# Patient Record
Sex: Female | Born: 1995 | Race: White | Hispanic: No | Marital: Single | Smoking: Never smoker
Health system: Southern US, Community
[De-identification: ages and names within clinical notes are randomized; demographics above are authoritative.]

## PROBLEM LIST (undated history)

## (undated) DIAGNOSIS — F909 Attention-deficit hyperactivity disorder, unspecified type: Secondary | ICD-10-CM

## (undated) DIAGNOSIS — J45909 Unspecified asthma, uncomplicated: Secondary | ICD-10-CM

## (undated) DIAGNOSIS — T7840XA Allergy, unspecified, initial encounter: Secondary | ICD-10-CM

## (undated) HISTORY — DX: Allergy, unspecified, initial encounter: T78.40XA

## (undated) HISTORY — DX: Attention-deficit hyperactivity disorder, unspecified type: F90.9

## (undated) HISTORY — DX: Unspecified asthma, uncomplicated: J45.909

---

## 2020-05-01 DIAGNOSIS — E860 Dehydration: Secondary | ICD-10-CM | POA: Diagnosis not present

## 2020-05-01 DIAGNOSIS — D72829 Elevated white blood cell count, unspecified: Secondary | ICD-10-CM | POA: Diagnosis not present

## 2020-05-01 DIAGNOSIS — R197 Diarrhea, unspecified: Secondary | ICD-10-CM | POA: Diagnosis not present

## 2020-05-01 DIAGNOSIS — R112 Nausea with vomiting, unspecified: Secondary | ICD-10-CM | POA: Diagnosis not present

## 2020-11-19 DIAGNOSIS — J101 Influenza due to other identified influenza virus with other respiratory manifestations: Secondary | ICD-10-CM | POA: Diagnosis not present

## 2020-12-31 DIAGNOSIS — D485 Neoplasm of uncertain behavior of skin: Secondary | ICD-10-CM | POA: Diagnosis not present

## 2020-12-31 DIAGNOSIS — D2271 Melanocytic nevi of right lower limb, including hip: Secondary | ICD-10-CM | POA: Diagnosis not present

## 2020-12-31 DIAGNOSIS — L700 Acne vulgaris: Secondary | ICD-10-CM | POA: Diagnosis not present

## 2021-01-21 DIAGNOSIS — D2271 Melanocytic nevi of right lower limb, including hip: Secondary | ICD-10-CM | POA: Diagnosis not present

## 2021-01-21 DIAGNOSIS — L988 Other specified disorders of the skin and subcutaneous tissue: Secondary | ICD-10-CM | POA: Diagnosis not present

## 2021-01-28 DIAGNOSIS — R5383 Other fatigue: Secondary | ICD-10-CM | POA: Diagnosis not present

## 2021-01-28 DIAGNOSIS — Z0000 Encounter for general adult medical examination without abnormal findings: Secondary | ICD-10-CM | POA: Diagnosis not present

## 2021-02-22 DIAGNOSIS — Z124 Encounter for screening for malignant neoplasm of cervix: Secondary | ICD-10-CM | POA: Diagnosis not present

## 2021-04-02 LAB — HM PAP SMEAR

## 2021-04-08 DIAGNOSIS — Z1152 Encounter for screening for COVID-19: Secondary | ICD-10-CM | POA: Diagnosis not present

## 2021-04-08 DIAGNOSIS — U071 COVID-19: Secondary | ICD-10-CM | POA: Diagnosis not present

## 2021-04-22 DIAGNOSIS — Z7189 Other specified counseling: Secondary | ICD-10-CM | POA: Diagnosis not present

## 2021-04-22 DIAGNOSIS — L700 Acne vulgaris: Secondary | ICD-10-CM | POA: Diagnosis not present

## 2021-04-22 DIAGNOSIS — D2271 Melanocytic nevi of right lower limb, including hip: Secondary | ICD-10-CM | POA: Diagnosis not present

## 2022-03-03 DIAGNOSIS — R0981 Nasal congestion: Secondary | ICD-10-CM | POA: Diagnosis not present

## 2022-03-03 DIAGNOSIS — Z20822 Contact with and (suspected) exposure to covid-19: Secondary | ICD-10-CM | POA: Diagnosis not present

## 2022-03-03 DIAGNOSIS — R051 Acute cough: Secondary | ICD-10-CM | POA: Diagnosis not present

## 2022-03-03 DIAGNOSIS — J00 Acute nasopharyngitis [common cold]: Secondary | ICD-10-CM | POA: Diagnosis not present

## 2022-04-15 ENCOUNTER — Ambulatory Visit (INDEPENDENT_AMBULATORY_CARE_PROVIDER_SITE_OTHER): Payer: BC Managed Care – PPO

## 2022-04-15 ENCOUNTER — Ambulatory Visit (INDEPENDENT_AMBULATORY_CARE_PROVIDER_SITE_OTHER): Payer: BC Managed Care – PPO | Admitting: Internal Medicine

## 2022-04-15 ENCOUNTER — Encounter: Payer: Self-pay | Admitting: Internal Medicine

## 2022-04-15 VITALS — BP 114/62 | HR 91 | Temp 98.4°F | Resp 16 | Ht 67.0 in | Wt 132.0 lb

## 2022-04-15 DIAGNOSIS — F900 Attention-deficit hyperactivity disorder, predominantly inattentive type: Secondary | ICD-10-CM | POA: Diagnosis not present

## 2022-04-15 DIAGNOSIS — J4520 Mild intermittent asthma, uncomplicated: Secondary | ICD-10-CM

## 2022-04-15 DIAGNOSIS — Z0001 Encounter for general adult medical examination with abnormal findings: Secondary | ICD-10-CM | POA: Diagnosis not present

## 2022-04-15 DIAGNOSIS — R053 Chronic cough: Secondary | ICD-10-CM | POA: Insufficient documentation

## 2022-04-15 DIAGNOSIS — R059 Cough, unspecified: Secondary | ICD-10-CM | POA: Diagnosis not present

## 2022-04-15 LAB — CBC WITH DIFFERENTIAL/PLATELET
Basophils Absolute: 0.1 10*3/uL (ref 0.0–0.1)
Basophils Relative: 0.8 % (ref 0.0–3.0)
Eosinophils Absolute: 0.2 10*3/uL (ref 0.0–0.7)
Eosinophils Relative: 2.5 % (ref 0.0–5.0)
HCT: 39.9 % (ref 36.0–46.0)
Hemoglobin: 13.3 g/dL (ref 12.0–15.0)
Lymphocytes Relative: 34.2 % (ref 12.0–46.0)
Lymphs Abs: 2.6 10*3/uL (ref 0.7–4.0)
MCHC: 33.4 g/dL (ref 30.0–36.0)
MCV: 78.4 fl (ref 78.0–100.0)
Monocytes Absolute: 0.4 10*3/uL (ref 0.1–1.0)
Monocytes Relative: 5.7 % (ref 3.0–12.0)
Neutro Abs: 4.2 10*3/uL (ref 1.4–7.7)
Neutrophils Relative %: 56.8 % (ref 43.0–77.0)
Platelets: 252 10*3/uL (ref 150.0–400.0)
RBC: 5.09 Mil/uL (ref 3.87–5.11)
RDW: 14.1 % (ref 11.5–15.5)
WBC: 7.5 10*3/uL (ref 4.0–10.5)

## 2022-04-15 LAB — COMPREHENSIVE METABOLIC PANEL
ALT: 12 U/L (ref 0–35)
AST: 16 U/L (ref 0–37)
Albumin: 4.9 g/dL (ref 3.5–5.2)
Alkaline Phosphatase: 32 U/L — ABNORMAL LOW (ref 39–117)
BUN: 11 mg/dL (ref 6–23)
CO2: 24 mEq/L (ref 19–32)
Calcium: 9.9 mg/dL (ref 8.4–10.5)
Chloride: 103 mEq/L (ref 96–112)
Creatinine, Ser: 0.82 mg/dL (ref 0.40–1.20)
GFR: 99.3 mL/min (ref >60.00)
Glucose, Bld: 86 mg/dL (ref 70–99)
Potassium: 3.7 mEq/L (ref 3.5–5.1)
Sodium: 137 mEq/L (ref 135–145)
Total Bilirubin: 0.6 mg/dL (ref 0.2–1.2)
Total Protein: 7.9 g/dL (ref 6.0–8.3)

## 2022-04-15 LAB — LIPID PANEL
Cholesterol: 161 mg/dL (ref 0–200)
HDL: 78.1 mg/dL (ref >39.00)
LDL Cholesterol: 74 mg/dL (ref 0–99)
NonHDL: 82.67
Total CHOL/HDL Ratio: 2
Triglycerides: 43 mg/dL (ref 0.0–149.0)
VLDL: 8.6 mg/dL (ref 0.0–40.0)

## 2022-04-15 LAB — C-REACTIVE PROTEIN: CRP: <1 mg/dL (ref 0.5–20.0)

## 2022-04-15 MED ORDER — AMPHETAMINE-DEXTROAMPHET ER 20 MG PO CP24: 20.0000 mg | ORAL_CAPSULE | ORAL | 0 refills | Status: DC

## 2022-04-15 MED ORDER — BUDESONIDE-FORMOTEROL FUMARATE 80-4.5 MCG/ACT IN AERO: 2.0000 | INHALATION_SPRAY | Freq: Two times a day (BID) | RESPIRATORY_TRACT | 1 refills | Status: DC

## 2022-04-15 NOTE — Patient Instructions (Signed)

## 2022-04-15 NOTE — Progress Notes (Signed)
Subjective:  Patient ID: Christy Lee, female    DOB: February 02, 1996  Age: 26 y.o. MRN: [id number]  CC: Annual Exam, Cough, and Asthma   HPI Christy Lee presents for a CPX and to establish.  She has a history of allergies and exercise-induced asthma.  For the last 3 months she has had nonproductive cough, chest tightness, and wheezing.  She has been using an albuterol inhaler.  She also has a history of ADHD and tells me she was well controlled with 20 mg of Adderall a day.  At work she feels distracted, has a lack of focus, and decreased productivity.   Past Medical History:  Diagnosis Date   ADHD    Allergy    Asthma    History reviewed. No pertinent surgical history.  reports that she has never smoked. She has never used smokeless tobacco. She reports current alcohol use of about 1.0 standard drink per week. She reports that she does not use drugs. family history includes Diabetes in her father; Healthy in her mother; Hyperlipidemia in her father; Hypertension in her father and paternal grandfather. Allergies  Allergen Reactions   Grass Pollen(K-O-R-T-Swt Vern)    Molds & Smuts     No outpatient medications prior to visit.   No facility-administered medications prior to visit.    ROS Review of Systems  Constitutional:  Negative for chills, diaphoresis, fatigue and fever.  HENT: Negative.  Negative for sore throat and trouble swallowing.   Respiratory:  Positive for cough, chest tightness and wheezing. Negative for shortness of breath and stridor.   Cardiovascular:  Negative for chest pain, palpitations and leg swelling.  Gastrointestinal:  Negative for abdominal pain, constipation, diarrhea, nausea and vomiting.  Genitourinary: Negative.  Negative for difficulty urinating.  Musculoskeletal: Negative.   Skin: Negative.  Negative for rash.  Neurological:  Negative for dizziness, weakness and light-headedness.  Hematological:  Negative for  adenopathy. Does not bruise/bleed easily.  Psychiatric/Behavioral:  Positive for decreased concentration. Negative for behavioral problems, dysphoric mood, self-injury and sleep disturbance. The patient is not nervous/anxious.    Objective:  BP 114/62 (BP Location: Left Arm, Patient Position: Sitting, Cuff Size: Large)   Pulse 91   Temp 98.4 F (36.9 C) (Oral)   Resp 16   Ht 5\' 7"  (1.702 m)   Wt 132 lb (59.9 kg)   LMP 04/15/2022 (Approximate) Comment: IUD  SpO2 99%   BMI 20.67 kg/m   BP Readings from Last 3 Encounters:  04/15/22 114/62    Wt Readings from Last 3 Encounters:  04/15/22 132 lb (59.9 kg)    Physical Exam Vitals reviewed.  Constitutional:      General: She is not in acute distress.    Appearance: Normal appearance. She is not ill-appearing, toxic-appearing or diaphoretic.  HENT:     Nose: Nose normal.     Mouth/Throat:     Mouth: Mucous membranes are moist.  Eyes:     General: No scleral icterus.    Conjunctiva/sclera: Conjunctivae normal.  Cardiovascular:     Rate and Rhythm: Normal rate and regular rhythm.     Heart sounds: No murmur heard.   No gallop.  Pulmonary:     Breath sounds: Examination of the right-middle field reveals wheezing. Wheezing present. No decreased breath sounds, rhonchi or rales.  Abdominal:     General: Abdomen is flat.     Palpations: There is no mass.     Tenderness: There is no abdominal tenderness. There is no  guarding.     Hernia: No hernia is present.  Musculoskeletal:        General: Normal range of motion.     Cervical back: Neck supple.     Right lower leg: No edema.     Left lower leg: No edema.  Lymphadenopathy:     Cervical: No cervical adenopathy.  Skin:    General: Skin is warm and dry.  Neurological:     General: No focal deficit present.     Mental Status: She is alert. Mental status is at baseline.  Psychiatric:        Mood and Affect: Mood normal.        Behavior: Behavior normal.    Lab Results   Component Value Date   WBC 7.5 04/15/2022   HGB 13.3 04/15/2022   HCT 39.9 04/15/2022   PLT 252.0 04/15/2022   GLUCOSE 86 04/15/2022   CHOL 161 04/15/2022   TRIG 43.0 04/15/2022   HDL 78.10 04/15/2022   LDLCALC 74 04/15/2022   ALT 12 04/15/2022   AST 16 04/15/2022   NA 137 04/15/2022   K 3.7 04/15/2022   CL 103 04/15/2022   CREATININE 0.82 04/15/2022   BUN 11 04/15/2022   CO2 24 04/15/2022    No results found.   Assessment & Plan:   Christy Lee was seen today for annual exam, cough and asthma.  Diagnoses and all orders for this visit:  Encounter for general adult medical examination with abnormal findings- Exam completed, labs reviewed, will vaccinate against pneumonia during her next visit, cancer screenings are up-to-date, patient education was given. -     Lipid panel; Future -     HIV Antibody (routine testing w rflx); Future -     HIV Antibody (routine testing w rflx) -     Lipid panel  ADHD (attention deficit hyperactivity disorder), inattentive type -     CBC with Differential/Platelet; Future -     C-reactive protein; Future -     Comprehensive metabolic panel; Future -     Discontinue: amphetamine-dextroamphetamine (ADDERALL XR) 20 MG 24 hr capsule; Take 1 capsule (20 mg total) by mouth every morning. -     Comprehensive metabolic panel -     C-reactive protein -     CBC with Differential/Platelet  Mild intermittent asthma without complication- Labs and chest x-ray are normal.  Will start a LABA/ICS inhaler. -     CBC with Differential/Platelet; Future -     C-reactive protein; Future -     Comprehensive metabolic panel; Future -     Discontinue: budesonide-formoterol (SYMBICORT) 80-4.5 MCG/ACT inhaler; Inhale 2 puffs into the lungs 2 (two) times daily. -     Comprehensive metabolic panel -     C-reactive protein -     CBC with Differential/Platelet -     mometasone-formoterol (DULERA) 100-5 MCG/ACT AERO; Inhale 2 puffs into the lungs 2 (two) times  daily.  Chronic cough- Her chest x-ray is negative for mass or infiltrate. -     CBC with Differential/Platelet; Future -     C-reactive protein; Future -     Comprehensive metabolic panel; Future -     DG Chest 2 View; Future -     Comprehensive metabolic panel -     C-reactive protein -     CBC with Differential/Platelet   I have discontinued Christy M. Sperl "Roz"'s budesonide-formoterol. I am also having her start on mometasone-formoterol.  Meds ordered this encounter  Medications  DISCONTD: amphetamine-dextroamphetamine (ADDERALL XR) 20 MG 24 hr capsule    Sig: Take 1 capsule (20 mg total) by mouth every morning.    Dispense:  90 capsule    Refill:  0   DISCONTD: budesonide-formoterol (SYMBICORT) 80-4.5 MCG/ACT inhaler    Sig: Inhale 2 puffs into the lungs 2 (two) times daily.    Dispense:  3 each    Refill:  1   mometasone-formoterol (DULERA) 100-5 MCG/ACT AERO    Sig: Inhale 2 puffs into the lungs 2 (two) times daily.    Dispense:  39 g    Refill:  1     Follow-up: Return in about 6 months (around 10/16/2022).  Christy Calico, MD

## 2022-04-16 ENCOUNTER — Telehealth: Payer: Self-pay | Admitting: Internal Medicine

## 2022-04-16 LAB — HIV ANTIBODY (ROUTINE TESTING W REFLEX): HIV 1&2 Ab, 4th Generation: NONREACTIVE

## 2022-04-16 MED ORDER — MOMETASONE FURO-FORMOTEROL FUM 100-5 MCG/ACT IN AERO: 2.0000 | INHALATION_SPRAY | Freq: Two times a day (BID) | RESPIRATORY_TRACT | 1 refills | Status: DC

## 2022-04-16 NOTE — Telephone Encounter (Signed)
Patient was prescribed symbicort yesterday - her insurance does not cover it and it was going to be $300.00 She would like for you to send in something comparable.  Please send to [facility] on [address].  Please advise ?

## 2022-04-17 ENCOUNTER — Telehealth: Payer: Self-pay | Admitting: Internal Medicine

## 2022-04-17 ENCOUNTER — Other Ambulatory Visit: Payer: Self-pay | Admitting: Internal Medicine

## 2022-04-17 DIAGNOSIS — F900 Attention-deficit hyperactivity disorder, predominantly inattentive type: Secondary | ICD-10-CM

## 2022-04-17 MED ORDER — AMPHETAMINE-DEXTROAMPHET ER 20 MG PO CP24: 20.0000 mg | ORAL_CAPSULE | ORAL | 0 refills | Status: DC

## 2022-04-17 NOTE — Telephone Encounter (Signed)
Patient was prescribed adderall 20 mg - it was sent in to [facility] [address] - They are out of this medication - Patient called another pharmacy and would like it sent in again to  [facility] on [address] - they have the medication

## 2022-05-08 ENCOUNTER — Telehealth: Payer: Self-pay | Admitting: *Deleted

## 2022-05-08 NOTE — Telephone Encounter (Signed)
Rec'd fax PA for pt [facility] submitted via cover-my-meds w/ Key: [id number]. Sent to plan for determination.Marland KitchenRaechel Chute

## 2022-05-09 NOTE — Telephone Encounter (Signed)
Check status on PA rec'd msg " Cancelled on June 8 Your request has been cancelled" Med has been refilled or is a duplicate request../lmb

## 2022-06-04 ENCOUNTER — Encounter: Payer: Self-pay | Admitting: Internal Medicine

## 2022-06-09 NOTE — Telephone Encounter (Signed)
See phone note from 05/08/22. PA for [facility] cancelled because it had been filled. Will try PA.Marland KitchenLMB   Tried to do PA for [facility] again. With insurance [employer]  rec'd msg " PA REQUIREMENT FOR DULERA 100-5MCG/ACT AEROSOL Not Required. No PA is needed.Marland KitchenRaechel Chute

## 2022-06-11 NOTE — Telephone Encounter (Addendum)
Submitted another PA to [facility] w/ Key: [id number]. Rec'd msg back stating " This request has received a Cancelled outcome. This may mean either your patient does not have active coverage with this plan, this authorization was processed as a duplicate request, or an authorization was not needed for this medication.  Rec'd fax PA form this am called # that was on form soke w/ [place]. Gave her patient information.Marland Kitchen PA was approved. Effective 05/13/22 through 06/12/23. Case ID # [id number]. Sent pt msg via mychart PA has been approved and fax to [employer]

## 2022-06-12 DIAGNOSIS — L700 Acne vulgaris: Secondary | ICD-10-CM | POA: Diagnosis not present

## 2022-06-12 DIAGNOSIS — D2371 Other benign neoplasm of skin of right lower limb, including hip: Secondary | ICD-10-CM | POA: Diagnosis not present

## 2022-06-12 DIAGNOSIS — Z872 Personal history of diseases of the skin and subcutaneous tissue: Secondary | ICD-10-CM | POA: Diagnosis not present

## 2022-06-12 DIAGNOSIS — L814 Other melanin hyperpigmentation: Secondary | ICD-10-CM | POA: Diagnosis not present

## 2022-10-16 ENCOUNTER — Other Ambulatory Visit: Payer: Self-pay | Admitting: Internal Medicine

## 2022-10-16 DIAGNOSIS — F900 Attention-deficit hyperactivity disorder, predominantly inattentive type: Secondary | ICD-10-CM

## 2022-12-11 ENCOUNTER — Ambulatory Visit: Payer: BC Managed Care – PPO | Admitting: Internal Medicine

## 2023-01-13 ENCOUNTER — Ambulatory Visit: Payer: BC Managed Care – PPO | Admitting: Internal Medicine

## 2023-01-26 ENCOUNTER — Encounter: Payer: Self-pay | Admitting: Internal Medicine

## 2023-01-26 ENCOUNTER — Ambulatory Visit: Payer: BC Managed Care – PPO | Admitting: Internal Medicine

## 2023-01-26 VITALS — BP 122/68 | HR 81 | Temp 98.2°F | Resp 16 | Ht 67.0 in | Wt 130.0 lb

## 2023-01-26 DIAGNOSIS — F900 Attention-deficit hyperactivity disorder, predominantly inattentive type: Secondary | ICD-10-CM

## 2023-01-26 DIAGNOSIS — L853 Xerosis cutis: Secondary | ICD-10-CM

## 2023-01-26 DIAGNOSIS — Z23 Encounter for immunization: Secondary | ICD-10-CM | POA: Diagnosis not present

## 2023-01-26 DIAGNOSIS — J4520 Mild intermittent asthma, uncomplicated: Secondary | ICD-10-CM

## 2023-01-26 MED ORDER — AMPHETAMINE-DEXTROAMPHET ER 20 MG PO CP24: 20.0000 mg | ORAL_CAPSULE | ORAL | 0 refills | Status: DC

## 2023-01-26 MED ORDER — MOMETASONE FURO-FORMOTEROL FUM 100-5 MCG/ACT IN AERO: 2.0000 | INHALATION_SPRAY | Freq: Two times a day (BID) | RESPIRATORY_TRACT | 1 refills | Status: DC

## 2023-01-26 MED ORDER — AIRSUPRA 90-80 MCG/ACT IN AERO: 2.0000 | INHALATION_SPRAY | Freq: Four times a day (QID) | RESPIRATORY_TRACT | 5 refills | Status: DC | PRN

## 2023-01-26 NOTE — Patient Instructions (Signed)
Asthma, Adult ? ?Asthma is a long-term (chronic) condition that causes recurrent episodes in which the lower airways in the lungs become tight and narrow. The narrowing is caused by inflammation and tightening of the smooth muscle around the lower airways. ?Asthma episodes, also called asthma attacks or asthma flares, may cause coughing, making high-pitched whistling sounds when you breathe, most often when you breathe out (wheezing), shortness of breath, and chest pain. The airways may produce extra mucus caused by the inflammation and irritation. During an attack, it can be difficult to breathe. Asthma attacks can range from minor to life-threatening. ?Asthma cannot be cured, but medicines and lifestyle changes can help control it and treat acute attacks. It is important to keep your asthma well controlled so the condition does not interfere with your daily life. ?What are the causes? ?This condition is believed to be caused by inherited (genetic) and environmental factors, but its exact cause is not known. ?What can trigger an asthma attack? ?Many things can bring on an asthma attack or make symptoms worse. These triggers are different for every person. Common triggers include: ?Allergens and irritants like mold, dust, pet dander, cockroaches, pollen, air pollution, and chemical odors. ?Cigarette smoke. ?Weather changes and cold air. ?Stress and strong emotional responses such as crying or laughing hard. ?Certain medications such as aspirin or beta blockers. ?Infections and inflammatory conditions, such as the flu, a cold, pneumonia, or inflammation of the nasal membranes (rhinitis). ?Gastroesophageal reflux disease (GERD). ?What are the signs or symptoms? ?Symptoms may occur right after exposure to an asthma trigger or hours later and can vary by person. Common signs and symptoms include: ?Wheezing. ?Trouble breathing (shortness of breath). ?Excessive nighttime or early morning coughing. ?Chest  tightness. ?Tiredness (fatigue) with minimal activity. ?Difficulty talking in complete sentences. ?Poor exercise tolerance. ?How is this diagnosed? ?This condition is diagnosed based on: ?A physical exam and your medical history. ?Tests, which may include: ?Lung function studies to evaluate the flow of air in your lungs. ?Allergy tests. ?Imaging tests, such as X-rays. ?How is this treated? ?There is no cure, but symptoms can be controlled with proper treatment. Treatment usually involves: ?Identifying and avoiding your asthma triggers. ?Inhaled medicines. Two types are commonly used to treat asthma, depending on severity: ?Controller medicines. These help prevent asthma symptoms from occurring. They are taken every day. ?Fast-acting reliever or rescue medicines. These quickly relieve asthma symptoms. They are used as needed and provide short-term relief. ?Using other medicines, such as: ?Allergy medicines, such as antihistamines, if your asthma attacks are triggered by allergens. ?Immune medicines (immunomodulators). These are medicines that help control the immune system. ?Using supplemental oxygen. This is only needed during a severe episode. ?Creating an asthma action plan. An asthma action plan is a written plan for managing and treating your asthma attacks. This plan includes: ?A list of your asthma triggers and how to avoid them. ?Information about when medicines should be taken and when their dosage should be changed. ?Instructions about using a device called a peak flow meter. A peak flow meter measures how well the lungs are working and the severity of your asthma. It helps you monitor your condition. ?Follow these instructions at home: ?Take over-the-counter and prescription medicines only as told by your health care provider. ?Stay up to date on all vaccinations as recommended by your healthcare provider, including vaccines for the flu and pneumonia. ?Use a peak flow meter and keep track of your peak flow  readings. ?Understand and use your asthma   action plan to address any asthma flares. ?Do not smoke or allow anyone to smoke in your home. ?Contact a health care provider if: ?You have wheezing, shortness of breath, or a cough that is not responding to medicines. ?Your medicines are causing side effects, such as a rash, itching, swelling, or trouble breathing. ?You need to use a reliever medicine more than 2-3 times a week. ?Your peak flow reading is still at 50-79% of your personal best after following your action plan for 1 hour. ?You have a fever and shortness of breath. ?Get help right away if: ?You are getting worse and do not respond to treatment during an asthma attack. ?You are short of breath when at rest or when doing very little physical activity. ?You have difficulty eating, drinking, or talking. ?You have chest pain or tightness. ?You develop a fast heartbeat or palpitations. ?You have a bluish color to your lips or fingernails. ?You are light-headed or dizzy, or you faint. ?Your peak flow reading is less than 50% of your personal best. ?You feel too tired to breathe normally. ?These symptoms may be an emergency. Get help right away. Call 911. ?Do not wait to see if the symptoms will go away. ?Do not drive yourself to the hospital. ?Summary ?Asthma is a long-term (chronic) condition that causes recurrent episodes in which the airways become tight and narrow. Asthma episodes, also called asthma attacks or asthma flares, can cause coughing, wheezing, shortness of breath, and chest pain. ?Asthma cannot be cured, but medicines and lifestyle changes can help keep it well controlled and prevent asthma flares. ?Make sure you understand how to avoid triggers and how and when to use your medicines. ?Asthma attacks can range from minor to life-threatening. Get help right away if you have an asthma attack and do not respond to treatment with your usual rescue medicines. ?This information is not intended to replace  advice given to you by your health care provider. Make sure you discuss any questions you have with your health care provider. ?Document Revised: 09/04/2021 Document Reviewed: 08/26/2021 ?Elsevier Patient Education ? 2023 Elsevier Inc. ? ?

## 2023-01-26 NOTE — Progress Notes (Signed)
Subjective:  Patient ID: Christy Lee, female    DOB: 07-Jan-1996  Age: 27 y.o. 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  She denies cough, hemoptysis, fever, chills, or night sweats.  She is concerned about dryness and discoloration over her knuckles.  Outpatient Medications Prior to Visit  Medication Sig Dispense Refill   amphetamine-dextroamphetamine (ADDERALL XR) 20 MG 24 hr capsule Take 1 capsule (20 mg total) by mouth every morning. 90 capsule 0   mometasone-formoterol (DULERA) 100-5 MCG/ACT AERO Inhale 2 puffs into the lungs 2 (two) times daily. 39 g 1   No facility-administered medications prior to visit.    ROS Review of Systems  Constitutional: Negative.  Negative for chills, diaphoresis, fatigue and fever.  HENT: Negative.    Eyes: Negative.   Respiratory:  Positive for chest tightness and wheezing. Negative for cough and shortness of breath.   Cardiovascular:  Negative for chest pain, palpitations and leg swelling.  Gastrointestinal:  Negative for abdominal pain, diarrhea, nausea and vomiting.  Endocrine: Negative.   Genitourinary: Negative.  Negative for difficulty urinating.  Musculoskeletal: Negative.   Skin: Negative.   Neurological: Negative.  Negative for dizziness, weakness and light-headedness.  Hematological:  Negative for adenopathy. Does not bruise/bleed easily.  Psychiatric/Behavioral: Negative.      Objective:  BP 122/68 (BP Location: Left Arm, Patient Position: Sitting, Cuff Size: Normal)   Pulse 81   Temp 98.2 F (36.8 C) (Oral)   Resp 16   Ht '5\' 7"'$  (1.702 m)   Wt 130 lb (59 kg)   LMP 01/23/2023 Comment: iud  SpO2 99%   BMI 20.36 kg/m   BP Readings from Last 3 Encounters:  01/26/23 122/68  04/15/22 114/62    Wt Readings from Last 3 Encounters:  01/26/23  130 lb (59 kg)  04/15/22 132 lb (59.9 kg)    Physical Exam Vitals reviewed.  HENT:     Nose: Nose normal.     Mouth/Throat:     Mouth: Mucous membranes are moist.  Eyes:     General: No scleral icterus.    Conjunctiva/sclera: Conjunctivae normal.  Cardiovascular:     Rate and Rhythm: Normal rate and regular rhythm.     Heart sounds: No murmur heard. Pulmonary:     Effort: Pulmonary effort is normal.     Breath sounds: No stridor. No wheezing, rhonchi or rales.  Abdominal:     General: Abdomen is flat.     Palpations: There is no mass.     Tenderness: There is no abdominal tenderness. There is no guarding.     Hernia: No hernia is present.  Musculoskeletal:        General: Normal range of motion.     Cervical back: Neck supple.     Right lower leg: No edema.     Left lower leg: No edema.  Lymphadenopathy:     Cervical: No cervical adenopathy.  Skin:    General: Skin is warm and dry.     Coloration: Skin is not pale.  Neurological:     General: No focal deficit present.     Mental Status: She is alert. Mental status is at baseline.  Psychiatric:        Mood and Affect: Mood normal.        Behavior: Behavior  normal.     Lab Results  Component Value Date   WBC 7.5 04/15/2022   HGB 13.3 04/15/2022   HCT 39.9 04/15/2022   PLT 252.0 04/15/2022   GLUCOSE 86 04/15/2022   CHOL 161 04/15/2022   TRIG 43.0 04/15/2022   HDL 78.10 04/15/2022   LDLCALC 74 04/15/2022   ALT 12 04/15/2022   AST 16 04/15/2022   NA 137 04/15/2022   K 3.7 04/15/2022   CL 103 04/15/2022   CREATININE 0.82 04/15/2022   BUN 11 04/15/2022   CO2 24 04/15/2022    Patient was never admitted.  Assessment & Plan:   Christy Lee was seen today for asthma.  Diagnoses and all orders for this visit:  Mild intermittent asthma without complication -     Albuterol-Budesonide (AIRSUPRA) 90-80 MCG/ACT AERO; Inhale 2 puffs into the lungs 4 (four) times daily as needed. -     mometasone-formoterol (DULERA)  100-5 MCG/ACT AERO; Inhale 2 puffs into the lungs 2 (two) times daily.  ADHD (attention deficit hyperactivity disorder), inattentive type -     amphetamine-dextroamphetamine (ADDERALL XR) 20 MG 24 hr capsule; Take 1 capsule (20 mg total) by mouth every morning.  Need for vaccination -     Pneumococcal conjugate vaccine 20-valent (Prevnar 20)  Dry skin- Will treat with cerave.   I am having Christy Lee "Christy Lee" start on Airsupra. I am also having her maintain her mometasone-formoterol and amphetamine-dextroamphetamine.  Meds ordered this encounter  Medications   Albuterol-Budesonide (AIRSUPRA) 90-80 MCG/ACT AERO    Sig: Inhale 2 puffs into the lungs 4 (four) times daily as needed.    Dispense:  10.7 g    Refill:  5   mometasone-formoterol (DULERA) 100-5 MCG/ACT AERO    Sig: Inhale 2 puffs into the lungs 2 (two) times daily.    Dispense:  39 g    Refill:  1   amphetamine-dextroamphetamine (ADDERALL XR) 20 MG 24 hr capsule    Sig: Take 1 capsule (20 mg total) by mouth every morning.    Dispense:  90 capsule    Refill:  0     Follow-up: Return in about 6 months (around 07/27/2023).  Scarlette Calico, MD

## 2023-01-30 ENCOUNTER — Encounter: Payer: Self-pay | Admitting: Internal Medicine

## 2023-01-30 DIAGNOSIS — L853 Xerosis cutis: Secondary | ICD-10-CM | POA: Insufficient documentation

## 2023-01-30 DIAGNOSIS — Z23 Encounter for immunization: Secondary | ICD-10-CM | POA: Insufficient documentation

## 2023-03-04 IMAGING — DX DG CHEST 2V
2 series · 2 of 2 positions shown · non-contrast
Comparison: None Available.

CLINICAL DATA: Intermittent cough for 3 months.

EXAM:
CHEST - 2 VIEW

[chest pa]
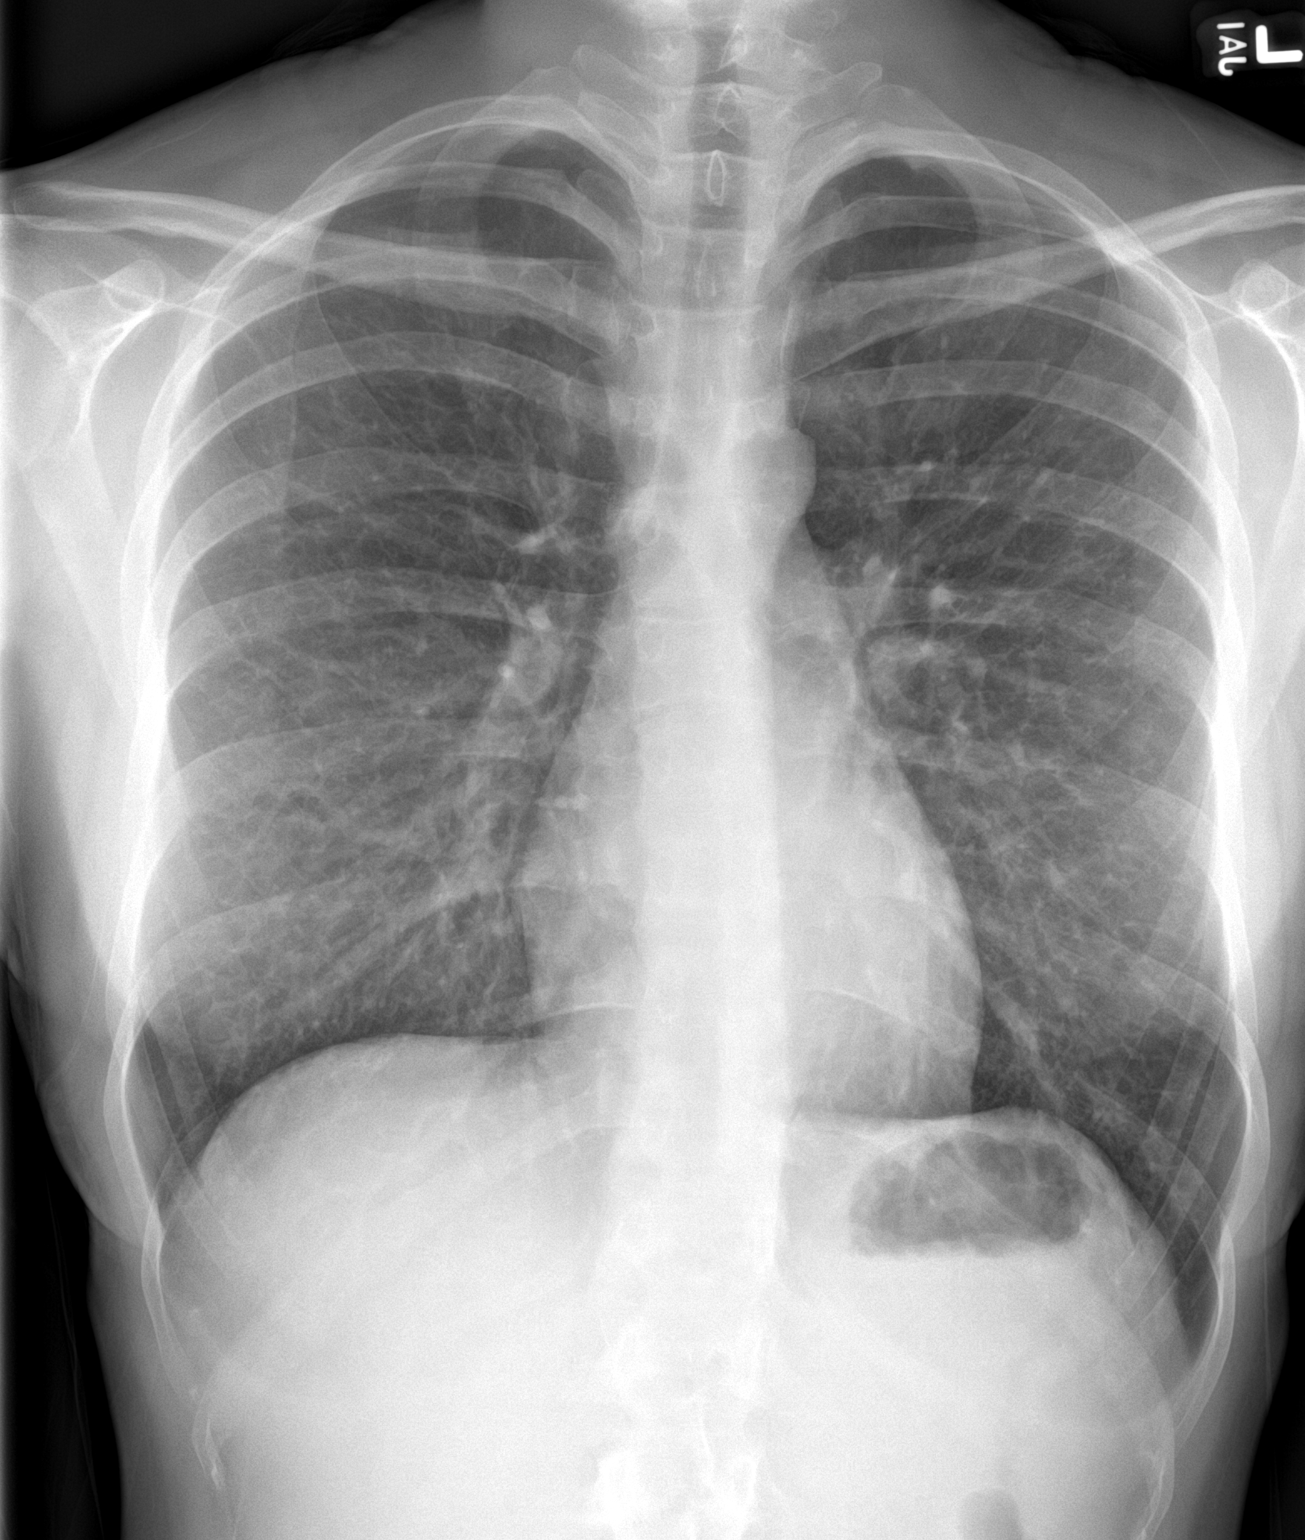

[chest lat]
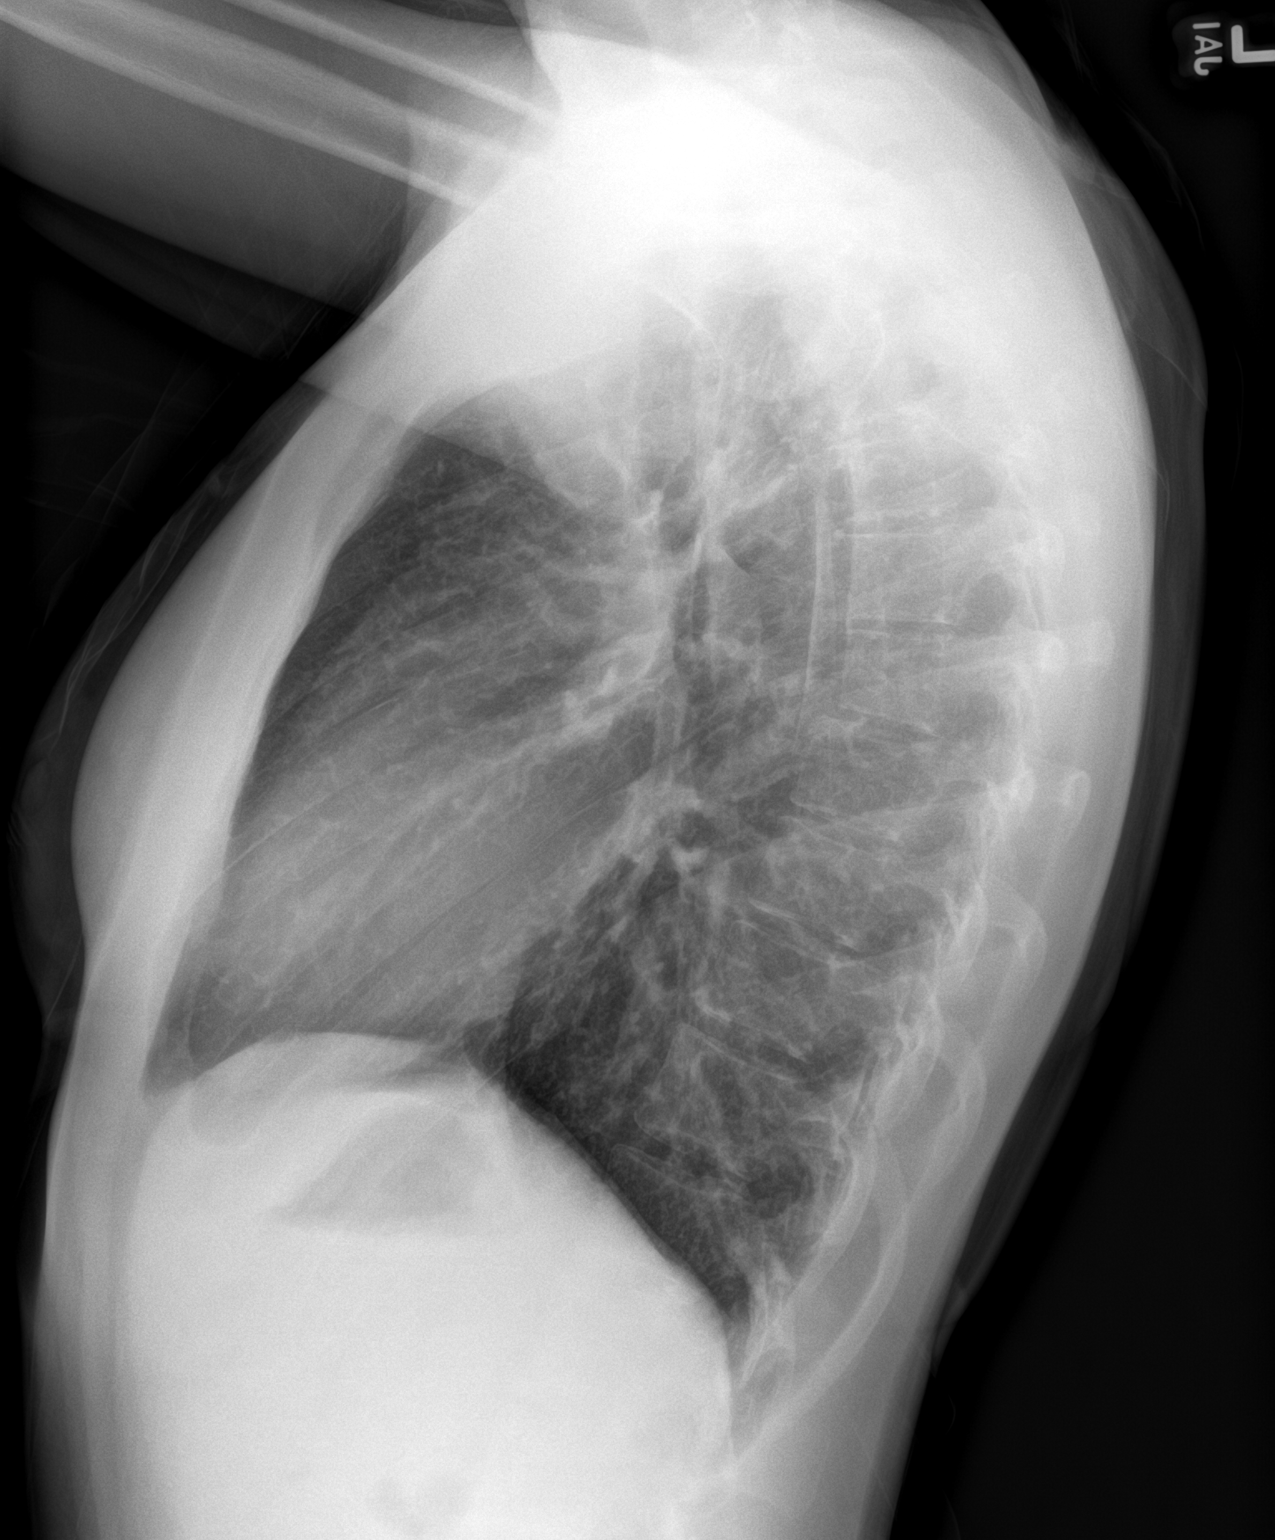

[2 of 2 positions shown; findings below may reference images not displayed]

FINDINGS: The heart size and mediastinal contours are within normal limits.
Both lungs are clear. Scoliosis of spine.
IMPRESSION: No active cardiopulmonary disease.

## 2023-07-09 ENCOUNTER — Encounter: Payer: Self-pay | Admitting: Internal Medicine

## 2023-07-09 ENCOUNTER — Ambulatory Visit: Payer: BC Managed Care – PPO | Admitting: Internal Medicine

## 2023-07-09 VITALS — BP 114/62 | HR 77 | Temp 98.1°F | Resp 16 | Ht 67.0 in | Wt 128.0 lb

## 2023-07-09 DIAGNOSIS — R5382 Chronic fatigue, unspecified: Secondary | ICD-10-CM | POA: Diagnosis not present

## 2023-07-09 DIAGNOSIS — Z0000 Encounter for general adult medical examination without abnormal findings: Secondary | ICD-10-CM | POA: Diagnosis not present

## 2023-07-09 DIAGNOSIS — Z23 Encounter for immunization: Secondary | ICD-10-CM

## 2023-07-09 DIAGNOSIS — J4520 Mild intermittent asthma, uncomplicated: Secondary | ICD-10-CM

## 2023-07-09 DIAGNOSIS — Z0001 Encounter for general adult medical examination with abnormal findings: Secondary | ICD-10-CM

## 2023-07-09 DIAGNOSIS — F900 Attention-deficit hyperactivity disorder, predominantly inattentive type: Secondary | ICD-10-CM | POA: Diagnosis not present

## 2023-07-09 DIAGNOSIS — Z1159 Encounter for screening for other viral diseases: Secondary | ICD-10-CM | POA: Diagnosis not present

## 2023-07-09 LAB — CBC WITH DIFFERENTIAL/PLATELET
Basophils Absolute: 0.1 10*3/uL (ref 0.0–0.1)
Basophils Relative: 1 % (ref 0.0–3.0)
Eosinophils Absolute: 0.1 10*3/uL (ref 0.0–0.7)
Eosinophils Relative: 1.2 % (ref 0.0–5.0)
HCT: 44.3 % (ref 36.0–46.0)
Hemoglobin: 14.2 g/dL (ref 12.0–15.0)
Lymphocytes Relative: 33.3 % (ref 12.0–46.0)
Lymphs Abs: 2.5 10*3/uL (ref 0.7–4.0)
MCHC: 32.1 g/dL (ref 30.0–36.0)
MCV: 81.4 fl (ref 78.0–100.0)
Monocytes Absolute: 0.5 10*3/uL (ref 0.1–1.0)
Monocytes Relative: 6.2 % (ref 3.0–12.0)
Neutro Abs: 4.3 10*3/uL (ref 1.4–7.7)
Neutrophils Relative %: 58.3 % (ref 43.0–77.0)
Platelets: 251 10*3/uL (ref 150.0–400.0)
RBC: 5.44 Mil/uL — ABNORMAL HIGH (ref 3.87–5.11)
RDW: 13.5 % (ref 11.5–15.5)
WBC: 7.4 10*3/uL (ref 4.0–10.5)

## 2023-07-09 LAB — BASIC METABOLIC PANEL
BUN: 16 mg/dL (ref 6–23)
CO2: 27 mEq/L (ref 19–32)
Calcium: 10 mg/dL (ref 8.4–10.5)
Chloride: 103 mEq/L (ref 96–112)
Creatinine, Ser: 0.73 mg/dL (ref 0.40–1.20)
GFR: 113.19 mL/min (ref >60.00)
Glucose, Bld: 88 mg/dL (ref 70–99)
Potassium: 4 mEq/L (ref 3.5–5.1)
Sodium: 137 mEq/L (ref 135–145)

## 2023-07-09 LAB — HEPATIC FUNCTION PANEL
ALT: 12 U/L (ref 0–35)
AST: 17 U/L (ref 0–37)
Albumin: 5 g/dL (ref 3.5–5.2)
Alkaline Phosphatase: 29 U/L — ABNORMAL LOW (ref 39–117)
Bilirubin, Direct: 0.1 mg/dL (ref 0.0–0.3)
Total Bilirubin: 0.5 mg/dL (ref 0.2–1.2)
Total Protein: 7.7 g/dL (ref 6.0–8.3)

## 2023-07-09 NOTE — Progress Notes (Signed)
Subjective:  Patient ID: Christy Lee, female    DOB: 30-Apr-1996  Age: 27 y.o. MRN: [id number]  CC: Annual Exam and Asthma   HPI Christy Lee presents for a CPX and f/up ----  Discussed the use of AI scribe software for clinical note transcription with the patient, who gave verbal consent to proceed.  History of Present Illness   The patient, with a history of asthma and ADHD, presents with concerns of potential thyroid dysfunction. Over the past few months, she has noticed a general sense of tiredness and sluggishness, dry skin and hair, and irregular menstrual cycles. The patient describes her menstrual cycle as irregular, with periods that come and go over several days, which is a new development. She also reports a sense of "brain fog," feeling less sharp than usual and experiencing some memory issues.  Despite training for a marathon, the patient has noticed potential weight gain, feeling that she is not as lean as she would expect given her level of physical activity. She denies any significant changes in appetite. The patient also denies any significant changes in bowel habits, with occasional diarrhea but no constipation.  The patient has been on an IUD Nigel Bridgeman) for contraception for several years, with no recent changes. She also continues to manage her asthma with two inhalers and her ADHD with amphetamines when in the office. The patient denies any pain or swelling over the thyroid gland, trouble swallowing, or any other significant symptoms.  The patient's blood pressure was noted to be lower than her previous reading, but she denies any symptoms of weakness, dizziness, or lightheadedness. The patient has no known family history of thyroid disease.       Outpatient Medications Prior to Visit  Medication Sig Dispense Refill   Albuterol-Budesonide (AIRSUPRA) 90-80 MCG/ACT AERO Inhale 2 puffs into the lungs 4 (four) times daily as needed. 10.7 g 5    amphetamine-dextroamphetamine (ADDERALL XR) 20 MG 24 hr capsule Take 1 capsule (20 mg total) by mouth every morning. 90 capsule 0   mometasone-formoterol (DULERA) 100-5 MCG/ACT AERO Inhale 2 puffs into the lungs 2 (two) times daily. 39 g 1   No facility-administered medications prior to visit.    ROS Review of Systems  Constitutional:  Positive for fatigue. Negative for appetite change, chills, diaphoresis, fever and unexpected weight change.  HENT: Negative.    Eyes: Negative.   Respiratory:  Negative for cough, chest tightness and shortness of breath.   Cardiovascular:  Negative for chest pain, palpitations and leg swelling.  Gastrointestinal:  Positive for diarrhea. Negative for abdominal pain, constipation, nausea and vomiting.  Endocrine: Negative.   Genitourinary: Negative.  Negative for difficulty urinating.  Musculoskeletal: Negative.  Negative for arthralgias, joint swelling and myalgias.  Skin: Negative.  Negative for color change and pallor.  Neurological: Negative.  Negative for dizziness, weakness, light-headedness and headaches.  Hematological:  Negative for adenopathy. Does not bruise/bleed easily.  Psychiatric/Behavioral:  Positive for decreased concentration. Negative for behavioral problems and suicidal ideas. The patient is not nervous/anxious.     Objective:  BP 114/62 (BP Location: Left Arm, Patient Position: Sitting, Cuff Size: Normal)   Pulse 77   Temp 98.1 F (36.7 C) (Oral)   Resp 16   Ht 5\' 7"  (1.702 m)   Wt 128 lb (58.1 kg)   LMP 07/02/2023 (Exact Date)   SpO2 98%   BMI 20.05 kg/m   BP Readings from Last 3 Encounters:  07/09/23 114/62  01/26/23 122/68  04/15/22 114/62    Wt Readings from Last 3 Encounters:  07/09/23 128 lb (58.1 kg)  01/26/23 130 lb (59 kg)  04/15/22 132 lb (59.9 kg)    Physical Exam Vitals reviewed.  Constitutional:      Appearance: Normal appearance.  HENT:     Nose: Nose normal.     Mouth/Throat:     Mouth: Mucous  membranes are moist.  Eyes:     General: No scleral icterus.    Conjunctiva/sclera: Conjunctivae normal.  Cardiovascular:     Rate and Rhythm: Normal rate and regular rhythm.     Pulses: Normal pulses.     Heart sounds: No murmur heard.    No friction rub. No gallop.  Pulmonary:     Effort: Pulmonary effort is normal.     Breath sounds: No stridor. No wheezing, rhonchi or rales.  Abdominal:     General: Abdomen is flat.     Palpations: There is no mass.     Tenderness: There is no abdominal tenderness. There is no guarding.     Hernia: No hernia is present.  Musculoskeletal:        General: Normal range of motion.     Cervical back: Neck supple.     Right lower leg: No edema.     Left lower leg: No edema.  Lymphadenopathy:     Cervical: No cervical adenopathy.  Skin:    General: Skin is warm and dry.  Neurological:     General: No focal deficit present.     Mental Status: She is alert. Mental status is at baseline.  Psychiatric:        Mood and Affect: Mood normal.        Behavior: Behavior normal.        Thought Content: Thought content normal.        Judgment: Judgment normal.     Lab Results  Component Value Date   WBC 7.4 07/09/2023   HGB 14.2 07/09/2023   HCT 44.3 07/09/2023   PLT 251.0 07/09/2023   GLUCOSE 88 07/09/2023   CHOL 161 04/15/2022   TRIG 43.0 04/15/2022   HDL 78.10 04/15/2022   LDLCALC 74 04/15/2022   ALT 12 07/09/2023   AST 17 07/09/2023   NA 137 07/09/2023   K 4.0 07/09/2023   CL 103 07/09/2023   CREATININE 0.73 07/09/2023   BUN 16 07/09/2023   CO2 27 07/09/2023   TSH 0.80 07/09/2023    Patient was never admitted.  Assessment & Plan:   ADHD (attention deficit hyperactivity disorder), inattentive type -     CBC with Differential/Platelet; Future  Encounter for general adult medical examination with abnormal findings- Exam completed, labs reviewed, vaccines reviewed and updated, cancer screenings addressed, pt ed material was given.   -     Hepatitis C antibody; Future  Mild intermittent asthma without complication- Sx's are well controlled. -     CBC with Differential/Platelet; Future  Chronic fatigue- Labs are negative for secondary causes. -     Thyroid Panel With TSH; Future -     CBC with Differential/Platelet; Future -     Hepatic function panel; Future -     Basic metabolic panel; Future  Need for hepatitis C screening test -     Hepatitis C antibody; Future  Need for vaccination  Other orders -     Tdap vaccine greater than or equal to 7yo IM     Follow-up: Return in about 6 months (  around 01/09/2024).  Sanda Linger, MD

## 2023-07-09 NOTE — Patient Instructions (Signed)
Fatigue If you have fatigue, you feel tired all the time and have a lack of energy or a lack of motivation. Fatigue may make it difficult to start or complete tasks because of exhaustion. Occasional or mild fatigue is often a normal response to activity or life. However, long-term (chronic) or extreme fatigue may be a symptom of a medical condition such as: Depression. Not having enough red blood cells or hemoglobin in the blood (anemia). A problem with a small gland located in the lower front part of the neck (thyroid disorder). Rheumatologic conditions. These are problems related to the body's defense system (immune system). Infections, especially certain viral infections. Fatigue can also lead to negative health outcomes over time. Follow these instructions at home: Medicines Take over-the-counter and prescription medicines only as told by your health care provider. Take a multivitamin if told by your health care provider. Do not use herbal or dietary supplements unless they are approved by your health care provider. Eating and drinking  Avoid heavy meals in the evening. Eat a well-balanced diet, which includes lean proteins, whole grains, plenty of fruits and vegetables, and low-fat dairy products. Avoid eating or drinking too many products with caffeine in them. Avoid alcohol. Drink enough fluid to keep your urine pale yellow. Activity  Exercise regularly, as told by your health care provider. Use or practice techniques to help you relax, such as yoga, tai chi, meditation, or massage therapy. Lifestyle Change situations that cause you stress. Try to keep your work and personal schedules in balance. Do not use recreational or illegal drugs. General instructions Monitor your fatigue for any changes. Go to bed and get up at the same time every day. Avoid fatigue by pacing yourself during the day and getting enough sleep at night. Maintain a healthy weight. Contact a health care  provider if: Your fatigue does not get better. You have a fever. You suddenly lose or gain weight. You have headaches. You have trouble falling asleep or sleeping through the night. You feel angry, guilty, anxious, or sad. You have swelling in your legs or another part of your body. Get help right away if: You feel confused, feel like you might faint, or faint. Your vision is blurry or you have a severe headache. You have severe pain in your abdomen, your back, or the area between your waist and hips (pelvis). You have chest pain, shortness of breath, or an irregular or fast heartbeat. You are unable to urinate, or you urinate less than normal. You have abnormal bleeding from the rectum, nose, lungs, nipples, or, if you are female, the vagina. You vomit blood. You have thoughts about hurting yourself or others. These symptoms may be an emergency. Get help right away. Call 911. Do not wait to see if the symptoms will go away. Do not drive yourself to the hospital. Get help right away if you feel like you may hurt yourself or others, or have thoughts about taking your own life. Go to your nearest emergency room or: Call 911. Call the National Suicide Prevention Lifeline at 1-800-273-8255 or 988. This is open 24 hours a day. Text the Crisis Text Line at 741741. Summary If you have fatigue, you feel tired all the time and have a lack of energy or a lack of motivation. Fatigue may make it difficult to start or complete tasks because of exhaustion. Long-term (chronic) or extreme fatigue may be a symptom of a medical condition. Exercise regularly, as told by your health care provider.   Change situations that cause you stress. Try to keep your work and personal schedules in balance. This information is not intended to replace advice given to you by your health care provider. Make sure you discuss any questions you have with your health care provider. Document Revised: 09/09/2021 Document  Reviewed: 09/09/2021 Elsevier Patient Education  2024 Elsevier Inc.  

## 2023-07-20 DIAGNOSIS — L814 Other melanin hyperpigmentation: Secondary | ICD-10-CM | POA: Diagnosis not present

## 2023-07-20 DIAGNOSIS — D225 Melanocytic nevi of trunk: Secondary | ICD-10-CM | POA: Diagnosis not present

## 2023-07-20 DIAGNOSIS — Z872 Personal history of diseases of the skin and subcutaneous tissue: Secondary | ICD-10-CM | POA: Diagnosis not present

## 2023-07-20 DIAGNOSIS — D2271 Melanocytic nevi of right lower limb, including hip: Secondary | ICD-10-CM | POA: Diagnosis not present

## 2023-08-10 DIAGNOSIS — M545 Low back pain, unspecified: Secondary | ICD-10-CM | POA: Diagnosis not present

## 2023-08-27 DIAGNOSIS — M545 Low back pain, unspecified: Secondary | ICD-10-CM | POA: Diagnosis not present

## 2023-09-02 DIAGNOSIS — M545 Low back pain, unspecified: Secondary | ICD-10-CM | POA: Diagnosis not present

## 2023-09-24 DIAGNOSIS — M545 Low back pain, unspecified: Secondary | ICD-10-CM | POA: Diagnosis not present

## 2023-11-17 DIAGNOSIS — M545 Low back pain, unspecified: Secondary | ICD-10-CM | POA: Diagnosis not present

## 2024-02-29 DIAGNOSIS — Z6820 Body mass index (BMI) 20.0-20.9, adult: Secondary | ICD-10-CM | POA: Diagnosis not present

## 2024-02-29 DIAGNOSIS — Z1331 Encounter for screening for depression: Secondary | ICD-10-CM | POA: Diagnosis not present

## 2024-02-29 DIAGNOSIS — Z01411 Encounter for gynecological examination (general) (routine) with abnormal findings: Secondary | ICD-10-CM | POA: Diagnosis not present

## 2024-02-29 DIAGNOSIS — Z124 Encounter for screening for malignant neoplasm of cervix: Secondary | ICD-10-CM | POA: Diagnosis not present

## 2024-02-29 DIAGNOSIS — L709 Acne, unspecified: Secondary | ICD-10-CM | POA: Diagnosis not present

## 2024-02-29 DIAGNOSIS — L680 Hirsutism: Secondary | ICD-10-CM | POA: Diagnosis not present

## 2024-03-09 DIAGNOSIS — N926 Irregular menstruation, unspecified: Secondary | ICD-10-CM | POA: Diagnosis not present

## 2024-03-09 DIAGNOSIS — N925 Other specified irregular menstruation: Secondary | ICD-10-CM | POA: Diagnosis not present

## 2024-03-09 DIAGNOSIS — L700 Acne vulgaris: Secondary | ICD-10-CM | POA: Diagnosis not present

## 2024-03-09 DIAGNOSIS — L658 Other specified nonscarring hair loss: Secondary | ICD-10-CM | POA: Diagnosis not present

## 2024-03-09 DIAGNOSIS — L659 Nonscarring hair loss, unspecified: Secondary | ICD-10-CM | POA: Diagnosis not present

## 2024-03-09 DIAGNOSIS — L680 Hirsutism: Secondary | ICD-10-CM | POA: Diagnosis not present

## 2024-03-21 DIAGNOSIS — Z30433 Encounter for removal and reinsertion of intrauterine contraceptive device: Secondary | ICD-10-CM | POA: Diagnosis not present

## 2024-03-30 ENCOUNTER — Encounter: Payer: Self-pay | Admitting: Internal Medicine

## 2024-03-30 ENCOUNTER — Ambulatory Visit (INDEPENDENT_AMBULATORY_CARE_PROVIDER_SITE_OTHER): Admitting: Internal Medicine

## 2024-03-30 ENCOUNTER — Ambulatory Visit (INDEPENDENT_AMBULATORY_CARE_PROVIDER_SITE_OTHER)

## 2024-03-30 VITALS — BP 114/70 | HR 80 | Temp 98.6°F | Resp 16 | Ht 67.0 in | Wt 125.6 lb

## 2024-03-30 DIAGNOSIS — S6992XA Unspecified injury of left wrist, hand and finger(s), initial encounter: Secondary | ICD-10-CM

## 2024-03-30 DIAGNOSIS — M79645 Pain in left finger(s): Secondary | ICD-10-CM | POA: Diagnosis not present

## 2024-03-30 DIAGNOSIS — F900 Attention-deficit hyperactivity disorder, predominantly inattentive type: Secondary | ICD-10-CM

## 2024-03-30 DIAGNOSIS — M7989 Other specified soft tissue disorders: Secondary | ICD-10-CM | POA: Diagnosis not present

## 2024-03-30 MED ORDER — AMPHETAMINE-DEXTROAMPHET ER 20 MG PO CP24
20.0000 mg | ORAL_CAPSULE | ORAL | 0 refills | Status: AC
Start: 2024-03-30 — End: ?

## 2024-03-30 NOTE — Progress Notes (Unsigned)
 Subjective:  Patient ID: Christy Lee, female    DOB: 13-Oct-1996  Age: 28 y.o. MRN: [id number]  CC: Thyroid  Problem (Requesting blood work to check TSH levels , had blood work with GYN that she wants rechecked checked about 2 weeks ago)   HPI [employer] presents for f/up ----  Discussed the use of AI scribe software for clinical note transcription with the patient, who gave verbal consent to proceed.  History of Present Illness   Christy Lee "Christy Lee" is a 28 year old female who presents with swelling and a raised divot on her left index finger.  A few weeks ago, her left index finger swelled and appeared as if it might be broken, although she does not recall any injury. The swelling has since resolved, but a slight raised divot remains. During the swelling, she experienced limited range of motion and difficulty bending the finger, describing it as 'almost like sticking.' Currently, the range of motion is normal, and the finger has not been bothersome in the last couple of weeks.  She had her wisdom teeth removed a couple of weeks ago without complications. Around the same time, during an annual OB GYN exam, blood work indicated low levels of alkaline phosphatase and MCH, suggestive of potential iron deficiency, although she was not diagnosed as anemic.  She has a history of asthma, which typically flares up when running outside in the summer. Her breathing has been better over the past four to five months, and she uses her inhalers as needed.   No coughing, night sweats, fevers, chills, stomach issues, nausea, vomiting, abdominal pain, diarrhea, constipation, or swelling in legs or feet.       Outpatient Medications Prior to Visit  Medication Sig Dispense Refill   Albuterol-Budesonide  (AIRSUPRA ) 90-80 MCG/ACT AERO Inhale 2 puffs into the lungs 4 (four) times daily as needed. 10.7 g 5   mometasone -formoterol  (DULERA) 100-5 MCG/ACT AERO Inhale 2  puffs into the lungs 2 (two) times daily. 39 g 1   amphetamine -dextroamphetamine (ADDERALL XR) 20 MG 24 hr capsule Take 1 capsule (20 mg total) by mouth every morning. 90 capsule 0   No facility-administered medications prior to visit.    ROS Review of Systems  Objective:  BP 114/70 (BP Location: Left Arm, Patient Position: Sitting)   Pulse 80   Temp 98.6 F (37 C) (Temporal)   Resp 16   Ht 5\' 7"  (1.702 m)   Wt 125 lb 9.6 oz (57 kg)   LMP 03/08/2024 (Exact Date)   SpO2 96%   BMI 19.67 kg/m   BP Readings from Last 3 Encounters:  03/30/24 114/70  07/09/23 114/62  01/26/23 122/68    Wt Readings from Last 3 Encounters:  03/30/24 125 lb 9.6 oz (57 kg)  07/09/23 128 lb (58.1 kg)  01/26/23 130 lb (59 kg)    Physical Exam  Lab Results  Component Value Date   WBC 7.4 07/09/2023   HGB 14.2 07/09/2023   HCT 44.3 07/09/2023   PLT 251.0 07/09/2023   GLUCOSE 88 07/09/2023   CHOL 161 04/15/2022   TRIG 43.0 04/15/2022   HDL 78.10 04/15/2022   LDLCALC 74 04/15/2022   ALT 12 07/09/2023   AST 17 07/09/2023   NA 137 07/09/2023   K 4.0 07/09/2023   CL 103 07/09/2023   CREATININE 0.73 07/09/2023   BUN 16 07/09/2023   CO2 27 07/09/2023   TSH 0.80 07/09/2023    Patient was never admitted.  Assessment &  Plan:  Injury of left index finger, initial encounter -     DG Finger Index Left; Future  ADHD (attention deficit hyperactivity disorder), inattentive type -     Amphetamine -Dextroamphet ER; Take 1 capsule (20 mg total) by mouth every morning.  Dispense: 90 capsule; Refill: 0     Follow-up: Return in about 6 months (around 09/29/2024).  Sandra Crouch, MD

## 2024-03-30 NOTE — Patient Instructions (Signed)
 [email or web address] [email or web address]   Attention Deficit Hyperactivity Disorder, Adult Attention deficit hyperactivity disorder (ADHD) is a mental health disorder that starts during childhood. For many people with ADHD, the disorder continues into the adult years. Treatment can help you manage your symptoms. There are three main types of ADHD: Inattentive. With this type, adults have difficulty paying attention. This may affect cognitive abilities. Hyperactive-impulsive. With this type, adults have a lot of energy and have difficulty controlling their behavior. Combination type. Some people may have symptoms of both types. What are the causes? The exact cause of ADHD is not known. Most experts believe a person's genes and environment possibly contribute to ADHD. What increases the risk? The following factors may make you more likely to develop this condition: Having a first-degree relative such as a parent, brother, or sister, with the condition. Being born before 37 weeks of pregnancy (prematurely) or at a low birth weight. Being born to a mother who smoked tobacco or drank alcohol during pregnancy. Having experienced a brain injury. Being exposed to lead or other toxins in the womb or early in life. What are the signs or symptoms? Symptoms of this condition depend on the type of ADHD. Symptoms of the inattentive type include: Difficulty paying attention or following instructions. Often making simple mistakes. Being disorganized. Avoiding tasks that require time and attention. Losing and forgetting things. Symptoms of the hyperactive-impulsive type include: Restlessness. Talking out of turn, interrupting others, or talking too much. Difficulty with: Sitting still. Feeling motivated. Relaxing. Waiting in line or waiting for a turn. People with the combination type have symptoms of both of the other types. In adults, this condition may lead to certain problems, such as: Keeping  jobs. Performing tasks at work. Having stable relationships. Being on time or keeping to a schedule. How is this diagnosed? This condition is diagnosed based on your current symptoms and your history of symptoms. The diagnosis can be made by a health care provider such as a primary care provider or a mental health care specialist. Your health care provider may use a symptom checklist or a behavior rating scale to evaluate your symptoms. Your health care provider may also want to talk with people who have observed your behaviors throughout your life. How is this treated? This condition can be treated with medicines and behavior therapy. Medicines may be the best option to reduce impulsive behaviors and improve attention. Your health care provider may recommend: Stimulant medicines. These are the most common medicines used for adult ADHD. They affect certain chemicals in the brain (neurotransmitters) and improve your ability to control your symptoms. A non-stimulant medicine. These medicines can also improve focus, attention, and impulsive behavior. It may take weeks to months to see the effects of this medicine. Counseling and behavioral management are also important for treating ADHD. Counseling is often used along with medicine. Your health care provider may suggest: Cognitive behavioral therapy (CBT). This type of therapy teaches you to replace negative thoughts and actions with positive thoughts and actions. When used as part of ADHD treatment, this therapy may also include: Coping strategies for organization, time management, impulse control, and stress reduction. Mindfulness and meditation training. Behavioral management. You may work with a Psychologist, occupational who is specially trained to help people with ADHD manage and organize activities and function more effectively. Follow these instructions at home: Medicines  Take over-the-counter and prescription medicines only as told by your health care  provider. Talk with your health care provider about the possible side effects of  your medicines and how to manage them. Alcohol use Do not drink alcohol if: Your health care provider tells you not to drink. You are pregnant, may be pregnant, or are planning to become pregnant. If you drink alcohol: Limit how much you use to: 0-1 drink a day for women. 0-2 drinks a day for men. Know how much alcohol is in your drink. In the [place] one drink equals one 12 oz bottle of beer (355 mL), one 5 oz glass of wine (148 mL), or one 1 oz glass of hard liquor (44 mL). Lifestyle  Do not use illegal drugs. Get enough sleep. Eat a healthy diet. Exercise regularly. Exercise can help to reduce stress and anxiety. General instructions Learn as much as you can about adult ADHD, and work closely with your health care providers to find the treatments that work best for you. Follow the same schedule each day. Use reminder devices like notes, calendars, and phone apps to stay on time and organized. Keep all follow-up visits. Your health care provider will need to monitor your condition and adjust your treatment over time. Where to find more information A health care provider may be able to recommend resources that are available online or over the phone. You could start with: Attention Deficit Disorder Association (ADDA): [employer] of Mental Health [facility]): BloggerCourse.com Contact a health care provider if: Your symptoms continue to cause problems. You have side effects from your medicine, such as: Repeated muscle twitches, coughing, or speech outbursts. Sleep problems. Loss of appetite. Dizziness. Unusually fast heartbeat. Stomach pains. Headaches. You are struggling with anxiety, depression, or substance abuse. Get help right away if: You have a severe reaction to a medicine. This symptom may be an emergency. Get help right away. Call 911. Do not wait to see if the symptom will go  away. Do not drive yourself to the hospital. Take one of these steps if you feel like you may hurt yourself or others, or have thoughts about taking your own life: Go to your nearest emergency room. Call 911. Call the National Suicide Prevention Lifeline at 718-753-0572 or 988. This is open 24 hours a day Text the Crisis Text Line at 2514372687. Summary ADHD is a mental health disorder that starts during childhood and often continues into your adult years. The exact cause of ADHD is not known. Most experts believe genetics and environmental factors contribute to ADHD. There is no cure for ADHD, but treatment with medicine, cognitive behavioral therapy, or behavioral management can help you manage your condition. This information is not intended to replace advice given to you by your health care provider. Make sure you discuss any questions you have with your health care provider. Document Revised: 03/07/2022 Document Reviewed: 03/07/2022 Elsevier Patient Education  2024 [employer].

## 2024-04-11 DIAGNOSIS — L700 Acne vulgaris: Secondary | ICD-10-CM | POA: Diagnosis not present

## 2024-04-11 DIAGNOSIS — D2271 Melanocytic nevi of right lower limb, including hip: Secondary | ICD-10-CM | POA: Diagnosis not present

## 2024-04-11 DIAGNOSIS — L814 Other melanin hyperpigmentation: Secondary | ICD-10-CM | POA: Diagnosis not present

## 2024-04-11 DIAGNOSIS — D2261 Melanocytic nevi of right upper limb, including shoulder: Secondary | ICD-10-CM | POA: Diagnosis not present

## 2024-04-21 DIAGNOSIS — Z30431 Encounter for routine checking of intrauterine contraceptive device: Secondary | ICD-10-CM | POA: Diagnosis not present

## 2024-07-27 ENCOUNTER — Encounter: Payer: Self-pay | Admitting: Internal Medicine

## 2024-07-29 ENCOUNTER — Other Ambulatory Visit: Payer: Self-pay | Admitting: Internal Medicine

## 2024-07-29 DIAGNOSIS — J208 Acute bronchitis due to other specified organisms: Secondary | ICD-10-CM | POA: Insufficient documentation

## 2024-07-29 MED ORDER — NIRMATRELVIR/RITONAVIR (PAXLOVID)TABLET
3.0000 | ORAL_TABLET | Freq: Two times a day (BID) | ORAL | 0 refills | Status: AC
Start: 2024-07-29 — End: 2024-08-03

## 2024-08-04 DIAGNOSIS — Z872 Personal history of diseases of the skin and subcutaneous tissue: Secondary | ICD-10-CM | POA: Diagnosis not present

## 2024-08-04 DIAGNOSIS — L700 Acne vulgaris: Secondary | ICD-10-CM | POA: Diagnosis not present

## 2024-08-04 DIAGNOSIS — L988 Other specified disorders of the skin and subcutaneous tissue: Secondary | ICD-10-CM | POA: Diagnosis not present

## 2024-08-04 DIAGNOSIS — D485 Neoplasm of uncertain behavior of skin: Secondary | ICD-10-CM | POA: Diagnosis not present

## 2024-10-03 ENCOUNTER — Encounter: Payer: Self-pay | Admitting: Family Medicine

## 2024-10-03 ENCOUNTER — Ambulatory Visit: Admitting: Family Medicine

## 2024-10-03 ENCOUNTER — Ambulatory Visit: Payer: Self-pay

## 2024-10-03 VITALS — BP 122/66 | HR 104 | Temp 99.1°F | Resp 18 | Ht 67.0 in | Wt 126.0 lb

## 2024-10-03 DIAGNOSIS — J4521 Mild intermittent asthma with (acute) exacerbation: Secondary | ICD-10-CM

## 2024-10-03 DIAGNOSIS — R051 Acute cough: Secondary | ICD-10-CM

## 2024-10-03 LAB — POCT INFLUENZA A/B
Influenza A, POC: NEGATIVE
Influenza B, POC: NEGATIVE

## 2024-10-03 MED ORDER — IPRATROPIUM-ALBUTEROL 0.5-2.5 (3) MG/3ML IN SOLN
3.0000 mL | Freq: Once | RESPIRATORY_TRACT | Status: AC
  Administered 2024-10-03: 3 mL via RESPIRATORY_TRACT

## 2024-10-03 MED ORDER — PREDNISONE 20 MG PO TABS: 40.0000 mg | ORAL_TABLET | Freq: Every day | ORAL | 0 refills | Status: AC

## 2024-10-03 MED ORDER — AIRSUPRA 90-80 MCG/ACT IN AERO: 2.0000 | INHALATION_SPRAY | Freq: Four times a day (QID) | RESPIRATORY_TRACT | 5 refills | Status: AC | PRN

## 2024-10-03 NOTE — Assessment & Plan Note (Signed)
-   Discussed expected duration and progression of viral URI. Advised to send MyChart message if she is not feeling better towards the end of the week and I would send antibiotic in for her.  - Administered nebulizer treatment, which patient responded well to. - Refilled [employer] prescription and discussed appropriate usage.  Orders:   POCT Influenza A/B   ipratropium-albuterol (DUONEB) 0.5-2.5 (3) MG/3ML nebulizer solution 3 mL   Albuterol-Budesonide  (AIRSUPRA ) 90-80 MCG/ACT AERO; Inhale 2 puffs into the lungs 4 (four) times daily as needed.   predniSONE (DELTASONE) 20 MG tablet; Take 2 tablets (40 mg total) by mouth daily for 5 days.

## 2024-10-03 NOTE — Telephone Encounter (Signed)
 FYI Only or Action Required?: FYI only for provider: appointment scheduled on 10/03/2024.  Patient was last seen in primary care on 03/30/2024 by Joshua Debby CROME, MD.  Called Nurse Triage reporting Cough.  Symptoms began several days ago.  Interventions attempted: OTC medications: Dayquil and Nyquil.  Symptoms are: gradually worsening.  Triage Disposition: See HCP Within 4 Hours (Or PCP Triage)  Patient/caregiver understands and will follow disposition?: Yes         Copied from CRM #[id number]. Topic: Clinical - Red Word Triage >> Oct 03, 2024  8:06 AM Olam RAMAN wrote: Red Word that prompted transfer to Nurse Triage: Having trouble breathing, wheezing, lots of coughing and stuff nose. Tightness of chest, might need an inhaler Reason for Disposition  Wheezing is present  Answer Assessment - Initial Assessment Questions Currently taking Dayquil and Nyquil for symptoms.    1. ONSET: When did the cough begin?      Over the weekend  2. SEVERITY: How bad is the cough today?      Mild  3. SPUTUM: Describe the color of your sputum (e.g., none, dry cough; clear, white, yellow, green)     Dark green in color  4. HEMOPTYSIS: Are you coughing up any blood? If Yes, ask: How much? (e.g., flecks, streaks, tablespoons, etc.)     Denies  5. DIFFICULTY BREATHING: Are you having difficulty breathing? If Yes, ask: How bad is it? (e.g., mild, moderate, severe)      Moderate  6. FEVER: Do you have a fever? If Yes, ask: What is your temperature, how was it measured, and when did it start?     Unsure  7. CARDIAC HISTORY: Do you have any history of heart disease? (e.g., heart attack, congestive heart failure)      Denies  8. LUNG HISTORY: Do you have any history of lung disease?  (e.g., pulmonary embolus, asthma, emphysema)     Exercise induced asthma  9. OTHER SYMPTOMS: Do you have any other symptoms? (e.g., runny nose, wheezing, chest pain)       Wheezing, trouble breathing  and chest tightness.  Protocols used: Cough - Acute Productive-A-AH

## 2024-10-03 NOTE — Progress Notes (Signed)
 Assessment & Plan Mild intermittent asthma with acute exacerbation - Discussed expected duration and progression of viral URI. Advised to send MyChart message if she is not feeling better towards the end of the week and I would send antibiotic in for her.  - Administered nebulizer treatment, which patient responded well to. - Refilled [employer] prescription and discussed appropriate usage.  Orders:   POCT Influenza A/B   ipratropium-albuterol (DUONEB) 0.5-2.5 (3) MG/3ML nebulizer solution 3 mL   Albuterol-Budesonide  (AIRSUPRA ) 90-80 MCG/ACT AERO; Inhale 2 puffs into the lungs 4 (four) times daily as needed.   predniSONE (DELTASONE) 20 MG tablet; Take 2 tablets (40 mg total) by mouth daily for 5 days.   Follow up plan: Return if symptoms worsen or fail to improve.  Niki Rung, MSN, APRN, FNP-C  Subjective:  HPI: Christy Lee is a 28 y.o. female presenting on 10/03/2024 for Cough (Started over weekend, chest congestion- dark green, cough, SOB, chest feels tight, wheezing, light headed this morning, dizzy, HA, unknown fever, fatigue, NO GI, no body aches /Hx asthma)  Discussed the use of AI scribe software for clinical note transcription with the patient, who gave verbal consent to proceed.  She is accompanied by her fianc, who she is okay with being present.  Her symptoms began over the weekend with rhinorrhea, sneezing, and pharyngitis, which then progressed to her chest. By yesterday, she was expectorating dark green sputum and experiencing increasing dyspnea, characterized by a very tight chest and loud wheezing.  This morning, she experienced near syncope after walking a short distance, describing it as 'seeing stars.' She has felt unwell throughout the morning.  She has a history of exercise-induced asthma for which she was prescribed Air Supra and Dulera last year while training for a marathon. However, she has not used these medications since completing the race  and is currently unable to locate them after moving houses.      ROS: Negative unless specifically indicated above in HPI.   Relevant past medical history reviewed and updated as indicated.   Allergies and medications reviewed and updated.   Current Outpatient Medications:    amphetamine -dextroamphetamine (ADDERALL XR) 20 MG 24 hr capsule, Take 1 capsule (20 mg total) by mouth every morning., Disp: 90 capsule, Rfl: 0   levonorgestrel  (KYLEENA ) 19.5 MG IUD, Take 1 device by intrauterine route., Disp: , Rfl:    predniSONE (DELTASONE) 20 MG tablet, Take 2 tablets (40 mg total) by mouth daily for 5 days., Disp: 10 tablet, Rfl: 0   Albuterol-Budesonide  (AIRSUPRA ) 90-80 MCG/ACT AERO, Inhale 2 puffs into the lungs 4 (four) times daily as needed., Disp: 10.7 g, Rfl: 5  Allergies  Allergen Reactions   Grass Pollen(K-O-R-T-Swt Vern)    Molds & Smuts     Objective:   BP 122/66   Pulse (!) 104 Comment: recheck after NEB Tx  Temp 99.1 F (37.3 C)   Resp 18   Ht 5' 7 (1.702 m)   Wt 126 lb (57.2 kg)   SpO2 97% Comment: recheck after NEB Tx  BMI 19.73 kg/m    Physical Exam Vitals reviewed.  Constitutional:      General: She is not in acute distress.    Appearance: Normal appearance. She is not ill-appearing, toxic-appearing or diaphoretic.  HENT:     Head: Normocephalic and atraumatic.     Right Ear: Ear canal and external ear normal. There is no impacted cerumen. Tympanic membrane is erythematous. Tympanic membrane is not scarred, perforated, retracted  or bulging.     Left Ear: Tympanic membrane, ear canal and external ear normal. There is no impacted cerumen.     Nose: Congestion present. No rhinorrhea.     Right Sinus: No maxillary sinus tenderness or frontal sinus tenderness.     Left Sinus: No maxillary sinus tenderness or frontal sinus tenderness.     Mouth/Throat:     Mouth: Mucous membranes are moist.     Pharynx: Oropharynx is clear. No oropharyngeal exudate or posterior  oropharyngeal erythema.  Eyes:     General: No scleral icterus.       Right eye: No discharge.        Left eye: No discharge.     Conjunctiva/sclera: Conjunctivae normal.  Cardiovascular:     Rate and Rhythm: Regular rhythm. Tachycardia present.     Heart sounds: Normal heart sounds. No murmur heard.    No friction rub. No gallop.  Pulmonary:     Effort: Pulmonary effort is normal. No respiratory distress.     Breath sounds: No stridor. Wheezing and rhonchi present. No rales.     Comments: Cleared to normal after nebulizer treatment. Musculoskeletal:        General: Normal range of motion.     Cervical back: Normal range of motion.  Lymphadenopathy:     Cervical: No cervical adenopathy.  Skin:    General: Skin is warm and dry.     Capillary Refill: Capillary refill takes less than 2 seconds.  Neurological:     General: No focal deficit present.     Mental Status: She is alert and oriented to person, place, and time. Mental status is at baseline.  Psychiatric:        Mood and Affect: Mood normal.        Behavior: Behavior normal.        Thought Content: Thought content normal.        Judgment: Judgment normal.

## 2024-10-25 DIAGNOSIS — J45901 Unspecified asthma with (acute) exacerbation: Secondary | ICD-10-CM | POA: Diagnosis not present

## 2025-04-03 ENCOUNTER — Encounter: Admitting: Internal Medicine
